# Patient Record
Sex: Female | Born: 1980 | Race: White | Hispanic: No | Marital: Single | Smoking: Current some day smoker
Health system: Southern US, Community
[De-identification: ages and names within clinical notes are randomized; demographics above are authoritative.]

---

## 2004-02-24 ENCOUNTER — Emergency Department: Payer: Self-pay | Admitting: Emergency Medicine

## 2012-07-20 ENCOUNTER — Emergency Department: Payer: Self-pay | Admitting: Emergency Medicine

## 2012-11-20 ENCOUNTER — Emergency Department: Payer: Self-pay | Admitting: Unknown Physician Specialty

## 2012-11-20 LAB — CBC
HCT: 48.3 % — ABNORMAL HIGH (ref 35.0–47.0)
HGB: 16.6 g/dL — ABNORMAL HIGH (ref 12.0–16.0)
MCH: 32.4 pg (ref 26.0–34.0)
MCV: 94 fL (ref 80–100)
RBC: 5.13 10*6/uL (ref 3.80–5.20)
RDW: 13.9 % (ref 11.5–14.5)

## 2012-11-20 LAB — BASIC METABOLIC PANEL
Anion Gap: 7 (ref 7–16)
Calcium, Total: 9 mg/dL (ref 8.5–10.1)
Chloride: 109 mmol/L — ABNORMAL HIGH (ref 98–107)
Creatinine: 0.98 mg/dL (ref 0.60–1.30)
EGFR (Non-African Amer.): >60
Potassium: 3.7 mmol/L (ref 3.5–5.1)

## 2012-11-20 LAB — TROPONIN I: Troponin-I: <0.02 ng/mL

## 2012-12-17 ENCOUNTER — Emergency Department: Payer: Self-pay | Admitting: Emergency Medicine

## 2012-12-19 ENCOUNTER — Ambulatory Visit: Payer: Self-pay | Admitting: Otolaryngology

## 2012-12-19 LAB — HCG, QUANTITATIVE, PREGNANCY: Beta Hcg, Quant.: <1 m[IU]/mL — ABNORMAL LOW

## 2014-03-11 IMAGING — CR DG CHEST 2V
1 series · 3 of 3 positions shown · non-contrast
Comparison: none

REASON FOR EXAM: cough
COMMENTS:

PROCEDURE:     DXR - DXR CHEST PA (OR AP) AND LATERAL  - November 20, 2012  [DATE]
RESULT:     Comparison: None.

[Series 1: pa · 0.17mm/px · 3 of 3 slices shown]
[im 1/3]
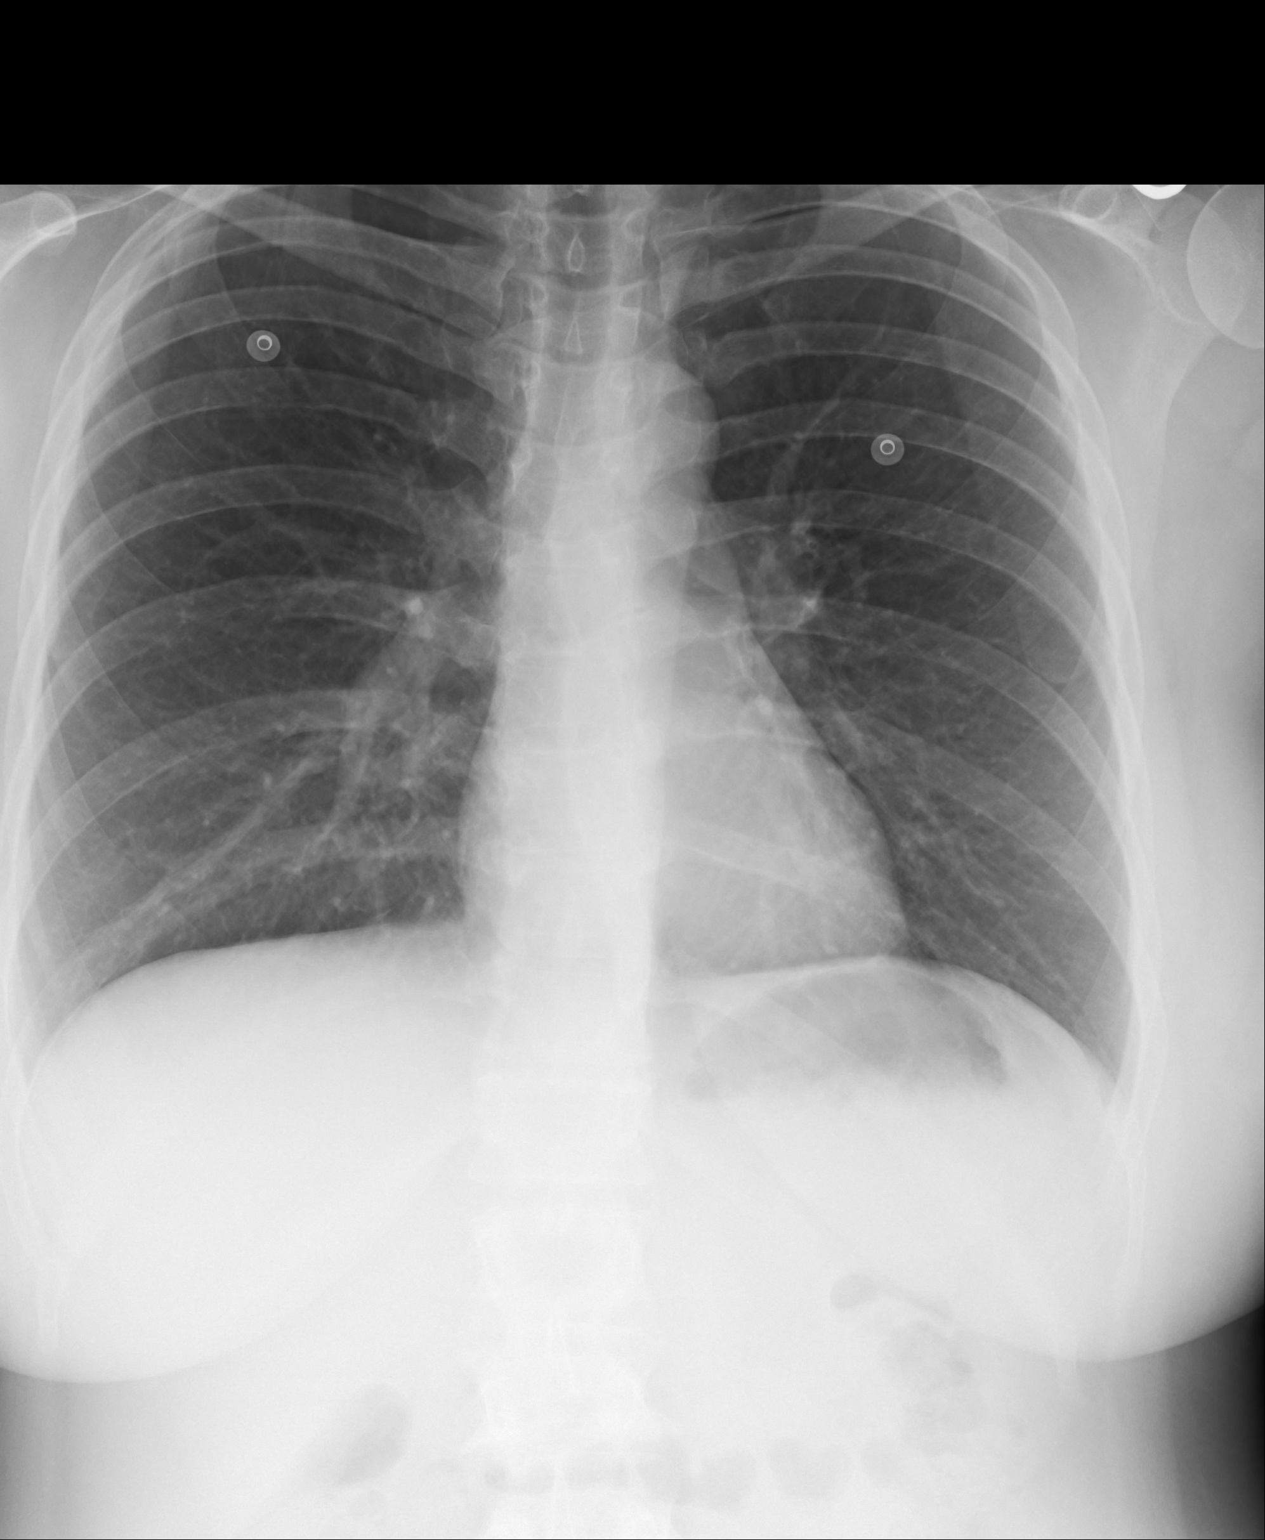
[im 2/3]
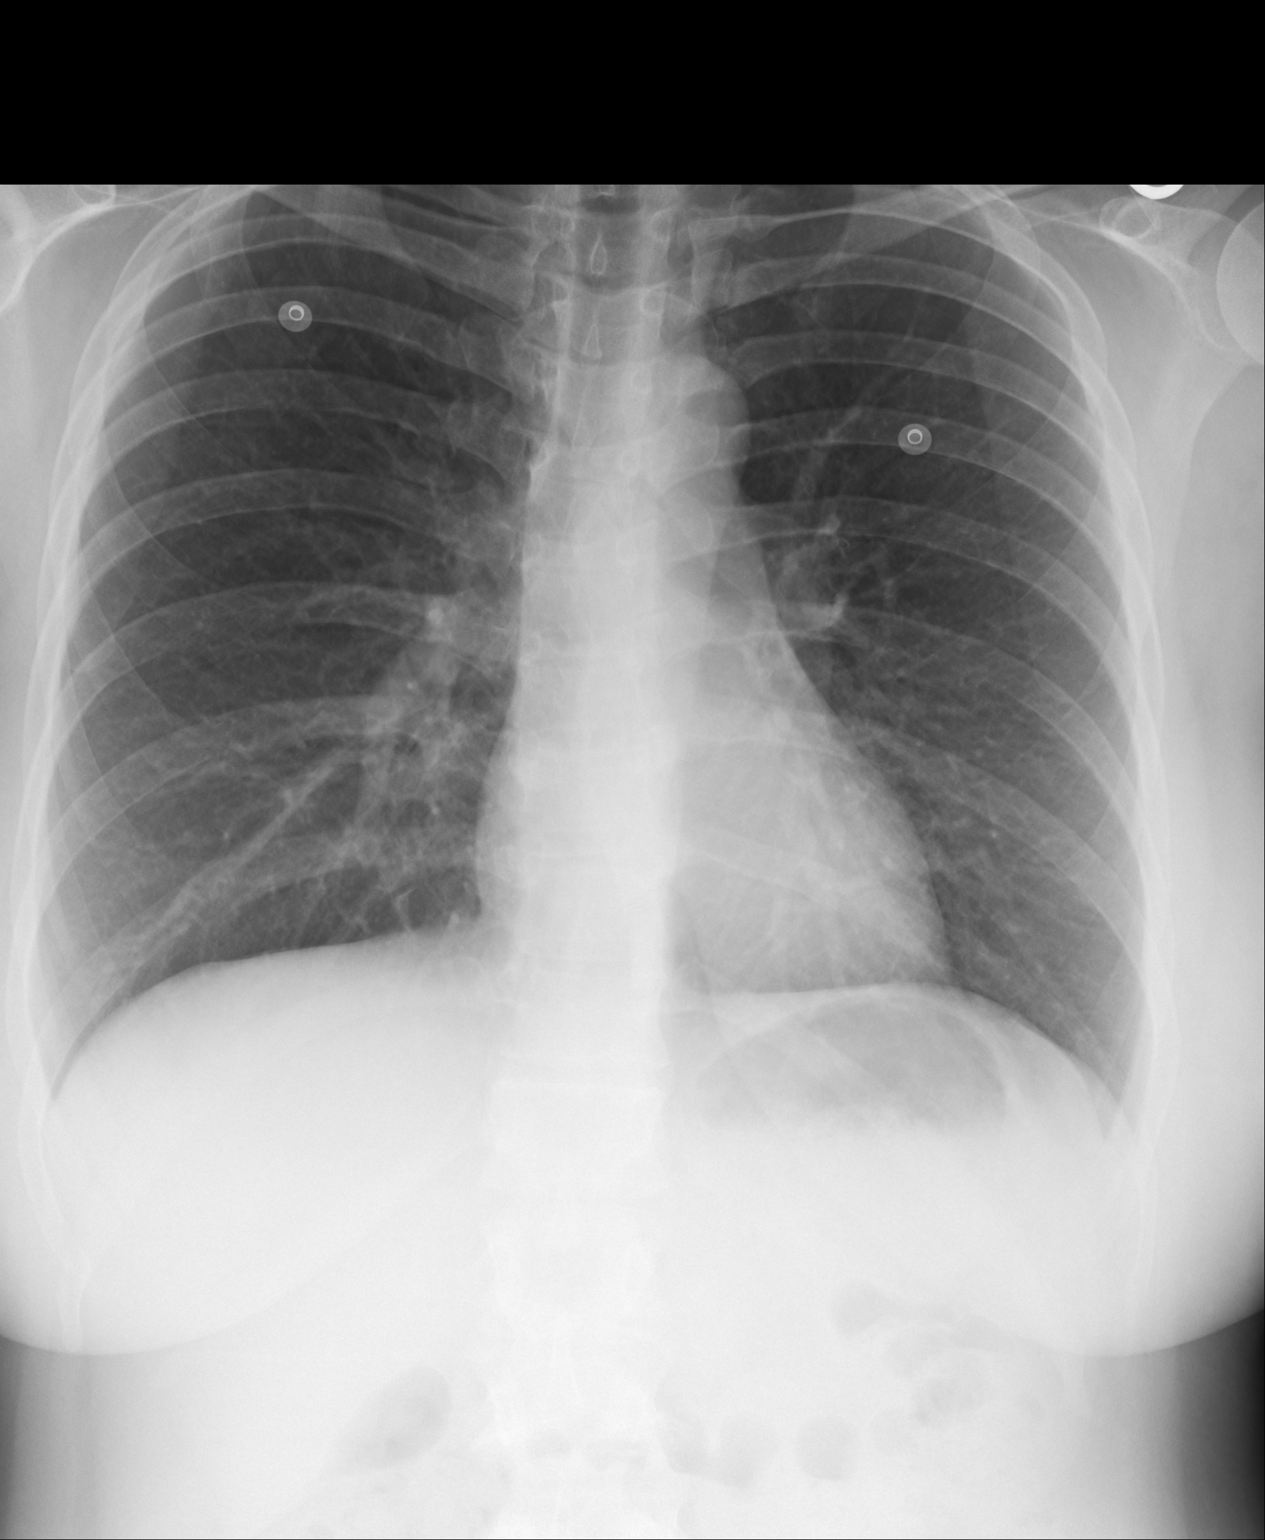
[im 3/3]
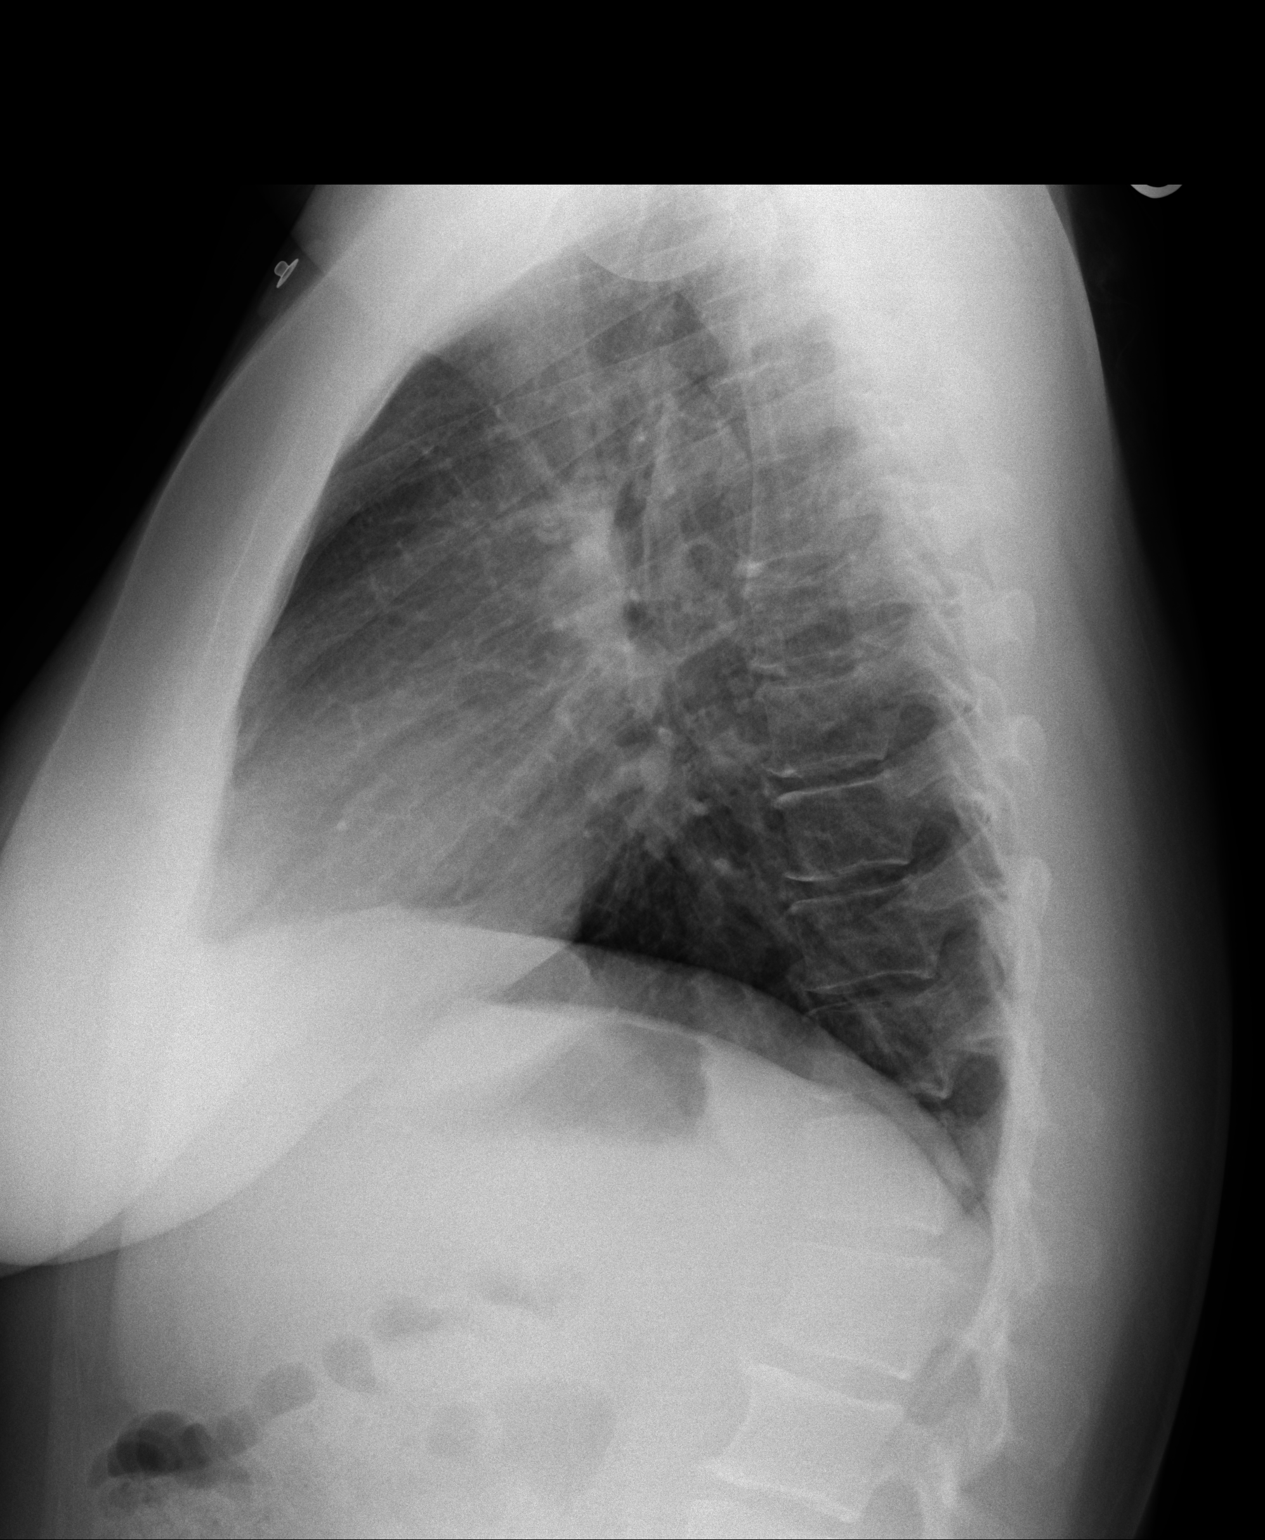

[3 of 3 positions shown; findings below may reference images not displayed]

FINDINGS: The heart and mediastinum are within normal limits. No focal pulmonary
opacities.
IMPRESSION: No acute cardiopulmonary disease.

[REDACTED]

## 2016-07-29 ENCOUNTER — Encounter (HOSPITAL_COMMUNITY): Payer: Self-pay | Admitting: *Deleted

## 2016-07-29 ENCOUNTER — Emergency Department (HOSPITAL_COMMUNITY): Payer: Medicaid - Out of State

## 2016-07-29 ENCOUNTER — Emergency Department (HOSPITAL_COMMUNITY)
Admission: EM | Admit: 2016-07-29 | Discharge: 2016-07-29 | Disposition: A | Discharge status: Home / Self Care | Payer: Medicaid - Out of State | Attending: Emergency Medicine | Admitting: Emergency Medicine

## 2016-07-29 DIAGNOSIS — S2232XA Fracture of one rib, left side, initial encounter for closed fracture: Secondary | ICD-10-CM | POA: Insufficient documentation

## 2016-07-29 DIAGNOSIS — F17200 Nicotine dependence, unspecified, uncomplicated: Secondary | ICD-10-CM | POA: Insufficient documentation

## 2016-07-29 DIAGNOSIS — Y939 Activity, unspecified: Secondary | ICD-10-CM | POA: Diagnosis not present

## 2016-07-29 DIAGNOSIS — Y999 Unspecified external cause status: Secondary | ICD-10-CM | POA: Diagnosis not present

## 2016-07-29 DIAGNOSIS — J4521 Mild intermittent asthma with (acute) exacerbation: Secondary | ICD-10-CM | POA: Diagnosis not present

## 2016-07-29 DIAGNOSIS — S299XXA Unspecified injury of thorax, initial encounter: Secondary | ICD-10-CM | POA: Diagnosis present

## 2016-07-29 DIAGNOSIS — Y929 Unspecified place or not applicable: Secondary | ICD-10-CM | POA: Diagnosis not present

## 2016-07-29 DIAGNOSIS — M7918 Myalgia, other site: Secondary | ICD-10-CM

## 2016-07-29 LAB — URINALYSIS, ROUTINE W REFLEX MICROSCOPIC
Bilirubin Urine: NEGATIVE
Glucose, UA: NEGATIVE mg/dL
Hgb urine dipstick: NEGATIVE
Ketones, ur: NEGATIVE mg/dL
Nitrite: NEGATIVE
Protein, ur: 30 mg/dL — AB
Specific Gravity, Urine: 1.02 (ref 1.005–1.030)
pH: 5 (ref 5.0–8.0)

## 2016-07-29 LAB — POC URINE PREG, ED: Preg Test, Ur: NEGATIVE

## 2016-07-29 MED ORDER — PREDNISONE 20 MG PO TABS
60.0000 mg | ORAL_TABLET | Freq: Once | ORAL | Status: AC
  Administered 2016-07-29: 60 mg via ORAL
  Filled 2016-07-29: qty 3

## 2016-07-29 MED ORDER — OXYCODONE-ACETAMINOPHEN 5-325 MG PO TABS
1.0000 | ORAL_TABLET | Freq: Once | ORAL | Status: AC
Start: 2016-07-29 — End: 2016-07-29
  Administered 2016-07-29: 1 via ORAL
  Filled 2016-07-29: qty 1

## 2016-07-29 MED ORDER — IBUPROFEN 600 MG PO TABS: 600.0000 mg | ORAL_TABLET | Freq: Four times a day (QID) | ORAL | 0 refills | Status: AC | PRN

## 2016-07-29 MED ORDER — OXYCODONE-ACETAMINOPHEN 5-325 MG PO TABS: 1.0000 | ORAL_TABLET | Freq: Four times a day (QID) | ORAL | 0 refills | Status: AC | PRN

## 2016-07-29 MED ORDER — IPRATROPIUM-ALBUTEROL 0.5-2.5 (3) MG/3ML IN SOLN
3.0000 mL | Freq: Once | RESPIRATORY_TRACT | Status: AC
  Administered 2016-07-29: 3 mL via RESPIRATORY_TRACT
  Filled 2016-07-29: qty 3

## 2016-07-29 MED ORDER — ALBUTEROL SULFATE HFA 108 (90 BASE) MCG/ACT IN AERS: 1.0000 | INHALATION_SPRAY | Freq: Four times a day (QID) | RESPIRATORY_TRACT | 0 refills | Status: AC | PRN

## 2016-07-29 MED ORDER — PREDNISONE 20 MG PO TABS: 60.0000 mg | ORAL_TABLET | Freq: Every day | ORAL | 0 refills | Status: AC
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
## 2016-07-29 NOTE — ED Notes (Signed)
Patient transported to X-ray 
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
## 2016-07-29 NOTE — ED Notes (Signed)
Leo at bedside
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
## 2016-09-02 ENCOUNTER — Emergency Department (HOSPITAL_COMMUNITY)
Admission: EM | Admit: 2016-09-02 | Discharge: 2016-09-02 | Discharge status: Against Medical Advice | Payer: Medicaid - Out of State

## 2016-09-02 NOTE — ED Notes (Signed)
Pt called for triage x 2 without answer  

## 2016-09-02 NOTE — ED Notes (Signed)
Pt called for triage x3. No answer.  

## 2016-09-03 ENCOUNTER — Encounter (HOSPITAL_COMMUNITY): Payer: Self-pay | Admitting: Emergency Medicine

## 2016-09-03 ENCOUNTER — Emergency Department (HOSPITAL_COMMUNITY): Payer: Medicaid - Out of State

## 2016-09-03 ENCOUNTER — Emergency Department (HOSPITAL_COMMUNITY)
Admission: EM | Admit: 2016-09-03 | Discharge: 2016-09-03 | Disposition: A | Discharge status: Home / Self Care | Payer: Medicaid - Out of State | Attending: Emergency Medicine | Admitting: Emergency Medicine

## 2016-09-03 DIAGNOSIS — O26891 Other specified pregnancy related conditions, first trimester: Secondary | ICD-10-CM | POA: Insufficient documentation

## 2016-09-03 DIAGNOSIS — O99331 Smoking (tobacco) complicating pregnancy, first trimester: Secondary | ICD-10-CM | POA: Diagnosis not present

## 2016-09-03 DIAGNOSIS — O0281 Inappropriate change in quantitative human chorionic gonadotropin (hCG) in early pregnancy: Secondary | ICD-10-CM | POA: Insufficient documentation

## 2016-09-03 DIAGNOSIS — Z3A01 Less than 8 weeks gestation of pregnancy: Secondary | ICD-10-CM

## 2016-09-03 DIAGNOSIS — R109 Unspecified abdominal pain: Secondary | ICD-10-CM | POA: Insufficient documentation

## 2016-09-03 DIAGNOSIS — Z3A08 8 weeks gestation of pregnancy: Secondary | ICD-10-CM | POA: Insufficient documentation

## 2016-09-03 DIAGNOSIS — Z79899 Other long term (current) drug therapy: Secondary | ICD-10-CM | POA: Diagnosis not present

## 2016-09-03 DIAGNOSIS — R935 Abnormal findings on diagnostic imaging of other abdominal regions, including retroperitoneum: Secondary | ICD-10-CM | POA: Insufficient documentation

## 2016-09-03 DIAGNOSIS — F17200 Nicotine dependence, unspecified, uncomplicated: Secondary | ICD-10-CM | POA: Insufficient documentation

## 2016-09-03 LAB — URINALYSIS, ROUTINE W REFLEX MICROSCOPIC
BILIRUBIN URINE: NEGATIVE
Bacteria, UA: NONE SEEN
Glucose, UA: NEGATIVE mg/dL
Ketones, ur: NEGATIVE mg/dL
Nitrite: NEGATIVE
Protein, ur: NEGATIVE mg/dL
SPECIFIC GRAVITY, URINE: 1.005 (ref 1.005–1.030)
pH: 7 (ref 5.0–8.0)

## 2016-09-03 LAB — CBC WITH DIFFERENTIAL/PLATELET
BASOS PCT: 0 %
Basophils Absolute: 0 10*3/uL (ref 0.0–0.1)
Eosinophils Absolute: 0 10*3/uL (ref 0.0–0.7)
Eosinophils Relative: 0 %
HCT: 45.7 % (ref 36.0–46.0)
HEMOGLOBIN: 16 g/dL — AB (ref 12.0–15.0)
LYMPHS PCT: 6 %
Lymphs Abs: 0.9 10*3/uL (ref 0.7–4.0)
MCH: 31.8 pg (ref 26.0–34.0)
MCHC: 35 g/dL (ref 30.0–36.0)
MCV: 90.9 fL (ref 78.0–100.0)
MONO ABS: 0.9 10*3/uL (ref 0.1–1.0)
MONOS PCT: 6 %
NEUTROS ABS: 13.6 10*3/uL — AB (ref 1.7–7.7)
NEUTROS PCT: 88 %
Platelets: 207 10*3/uL (ref 150–400)
RBC: 5.03 MIL/uL (ref 3.87–5.11)
RDW: 12.8 % (ref 11.5–15.5)
WBC: 15.5 10*3/uL — ABNORMAL HIGH (ref 4.0–10.5)

## 2016-09-03 LAB — BASIC METABOLIC PANEL
Anion gap: 6 (ref 5–15)
BUN: 9 mg/dL (ref 6–20)
CALCIUM: 8.7 mg/dL — AB (ref 8.9–10.3)
CHLORIDE: 101 mmol/L (ref 101–111)
CO2: 26 mmol/L (ref 22–32)
Creatinine, Ser: 1.03 mg/dL — ABNORMAL HIGH (ref 0.44–1.00)
GFR calc non Af Amer: >60 mL/min (ref >60)
Glucose, Bld: 120 mg/dL — ABNORMAL HIGH (ref 65–99)
POTASSIUM: 3.8 mmol/L (ref 3.5–5.1)
Sodium: 133 mmol/L — ABNORMAL LOW (ref 135–145)

## 2016-09-03 LAB — HCG, QUANTITATIVE, PREGNANCY: hCG, Beta Chain, Quant, S: 419 m[IU]/mL — ABNORMAL HIGH (ref <5)

## 2016-09-03 MED ORDER — ACETAMINOPHEN 325 MG PO TABS
650.0000 mg | ORAL_TABLET | Freq: Once | ORAL | Status: AC
  Administered 2016-09-03: 650 mg via ORAL
  Filled 2016-09-03: qty 2

## 2016-09-03 MED ORDER — PRENATAL COMPLETE 14-0.4 MG PO TABS: 1.0000 | ORAL_TABLET | Freq: Every day | ORAL | 0 refills | Status: AC

## 2016-09-03 NOTE — Discharge Instructions (Signed)
I would continue the keflex you were prescribed yesterday.  Can take tylenol for pain (do not take more than 2g tylenol in a day). Your urine has been sent for culture so we will call you if anything needs to be changed. Follow-up with the [facility] clinic for ongoing prenatal care. Return here for new concerns.

## 2016-09-03 NOTE — ED Notes (Signed)
Patient transported to Ultrasound 

## 2016-09-03 NOTE — ED Provider Notes (Signed)
[facility] Provider Note   CSN: [id number] Arrival date & time: 09/03/16  9147     History   Chief Complaint Chief Complaint  Patient presents with  . Flank Pain    HPI Paige Bradley is a 36 y.o. female.  The history is provided by the patient and medical records.    36 y.o. F here with bilateral flank pain.  States she has been having dysuria and urinary frequency for about a week now. States she was seen at urgent care yesterday diagnosed with UTI and started on keflex.  She also found out she was pregnant yesterday.  Unsure how far along she is.  States LMP was beginning of March.  States she has been taking the keflex as directed but has only had 2 doses thus far.  States pain has not gotten any better.  Reports pain in both flanks, left slightly worse than right.  She has not had any pelvic pain, vaginal discharge, or vaginal bleeding.  Some nausea but denies vomiting.  Reports fever last night.  States hx of UTI's with prior pregnancies.  No hx of ectopic.  History reviewed. No pertinent past medical history.  There are no active problems to display for this patient.   History reviewed. No pertinent surgical history.  OB History    Gravida Para Term Preterm AB Living   4         2   SAB TAB Ectopic Multiple Live Births                   Home Medications    Prior to Admission medications   Medication Sig Start Date End Date Taking? Authorizing Provider  albuterol (PROVENTIL HFA;VENTOLIN HFA) 108 (90 Base) MCG/ACT inhaler Inhale 1-2 puffs into the lungs every 6 (six) hours as needed for wheezing or shortness of breath. 07/29/16   Audry Pili, PA-C  ibuprofen (ADVIL,MOTRIN) 600 MG tablet Take 1 tablet (600 mg total) by mouth every 6 (six) hours as needed. 07/29/16   Audry Pili, PA-C  oxyCODONE-acetaminophen (PERCOCET/ROXICET) 5-325 MG tablet Take 1 tablet by mouth every 6 (six) hours as needed for severe pain. 07/29/16   Audry Pili, PA-C    Family  History No family history on file.  Social History Social History  Substance Use Topics  . Smoking status: Current Some Day Smoker  . Smokeless tobacco: Never Used  . Alcohol use No     Allergies   Patient has no known allergies.   Review of Systems Review of Systems  Genitourinary: Positive for dysuria, flank pain and frequency. Negative for menstrual problem, pelvic pain, vaginal bleeding, vaginal discharge and vaginal pain.  All other systems reviewed and are negative.    Physical Exam Updated Vital Signs BP (!) 158/111   Pulse (!) 110   Temp 98.8 F (37.1 C) (Oral)   Resp 20   LMP 07/11/2016   SpO2 99%   Physical Exam  Constitutional: She is oriented to person, place, and time. She appears well-developed and well-nourished.  HENT:  Head: Normocephalic and atraumatic.  Mouth/Throat: Oropharynx is clear and moist.  Eyes: Conjunctivae and EOM are normal. Pupils are equal, round, and reactive to light.  Neck: Normal range of motion.  Cardiovascular: Normal rate, regular rhythm and normal heart sounds.   Pulmonary/Chest: Effort normal and breath sounds normal. No respiratory distress. She has no wheezes.  Abdominal: Soft. Bowel sounds are normal. There is no tenderness. There is CVA tenderness. There is  no rebound.  bilteral CVA tenderness noted, L > R No abdominal or pelvic tenderness  Musculoskeletal: Normal range of motion.  Neurological: She is alert and oriented to person, place, and time.  Skin: Skin is warm and dry.  Psychiatric: She has a normal mood and affect.  Nursing note and vitals reviewed.    ED Treatments / Results  Labs (all labs ordered are listed, but only abnormal results are displayed) Labs Reviewed  CBC WITH DIFFERENTIAL/PLATELET - Abnormal; Notable for the following:       Result Value   WBC 15.5 (*)    Hemoglobin 16.0 (*)    Neutro Abs 13.6 (*)    All other components within normal limits  BASIC METABOLIC PANEL - Abnormal; Notable  for the following:    Sodium 133 (*)    Glucose, Bld 120 (*)    Creatinine, Ser 1.03 (*)    Calcium 8.7 (*)    All other components within normal limits  HCG, QUANTITATIVE, PREGNANCY - Abnormal; Notable for the following:    hCG, Beta Chain, Quant, S 419 (*)    All other components within normal limits  URINALYSIS, ROUTINE W REFLEX MICROSCOPIC - Abnormal; Notable for the following:    Color, Urine STRAW (*)    Hgb urine dipstick SMALL (*)    Leukocytes, UA TRACE (*)    Squamous Epithelial / LPF 0-5 (*)    All other components within normal limits  URINE CULTURE    EKG  EKG Interpretation None       Radiology US Renal  Result Date: 09/03/2016 CLINICAL DATA:  Left-sided flank pain for 3 days EXAM: RENAL / URINARY TRACT ULTRASOUND COMPLETE COMPARISON:  None. FINDINGS: Right Kidney: Length: 10.3 cm. Echogenicity within normal limits. No mass or hydronephrosis visualized. Left Kidney: Length: 11.4 cm. Echogenicity within normal limits. No mass or hydronephrosis visualized. Bladder: Appears normal for degree of bladder distention. IMPRESSION: No acute abnormality noted. Electronically Signed   By: Alcide Clever M.D.   On: 09/03/2016 09:57    Procedures Procedures (including critical care time)  Medications Ordered in ED Medications  acetaminophen (TYLENOL) tablet 650 mg (650 mg Oral Given 09/03/16 0917)     Initial Impression / Assessment and Plan / ED Course  I have reviewed the triage vital signs and the nursing notes.  Pertinent labs & imaging results that were available during my care of the patient were reviewed by me and considered in my medical decision making (see chart for details).  36 y.o. F here with flank pain.  Reports urinary symptoms x1 week.  Started on keflex yesterday.  Also found out she was pregnant yesterday.  States no change after meds.  She is afebrile, non-toxic in appearance here.  Abdomen soft, CVA tenderness bilaterally. Remainder of abdomen is soft  and benign. Denies pelvic pain, vaginal discharge, or vaginal bleeding.  Reports LMP beginning of march.  Seen here on 07/29/16 and negative pregnancy test at that time.  Will plan for labs, UA, renal ultrasound.  Will get hcg as well.  hcg 419 consistent with about [redacted]wk gestation.  Mild leukocytosis.  UA with trace leuks, 6-30 WBC, small hemoglobin.  Sent for culture.  Renal US was obtained-- no acute findings of hydronephrosis to suggest acute stone.  On re-check patient is resting comfortably, however she did express she was upset she has not receive any stronger pain medication.  Given her pregnancy, do not feel narcotics are indicated which I have discussed with  her.  She remains without pelvic pain, vaginal bleeding, or loss of fluids.  Unlikely to see gestational findings on [place] given early pregnancy.  No red flag symptoms concerning for ectopic pregnancy at this time and no hx of same.  I have encouraged her to continue keflex pending urine culture, will start prenatals.  Does not currently have GYN so referred to women's clinic for follow-up for close follow-up.  Return precautions were given for new/worsening symptoms.  At time of discharge, notified by RN that patient was somewhat tachycardic around 120.  HR has been mildly elevated here.  No chest pain or SOB.  BP mildly elevated as well. Attempted to go in and reassess, however patient had already left.    Case discussed with attending physician, Dr. Fayrene Fearing, who agrees with assessment and plan of care.  Final Clinical Impressions(s) / ED Diagnoses   Final diagnoses:  Left flank pain  Less than [redacted] weeks gestation of pregnancy    New Prescriptions Discharge Medication List as of 09/03/2016 11:09 AM    START taking these medications   Details  Prenatal Vit-Fe Fumarate-FA (PRENATAL COMPLETE) 14-0.4 MG TABS Take 1 tablet by mouth daily., Starting Fri 09/03/2016, Print         Garlon Hatchet, PA-C 09/03/16 1232    Rolland Porter,  MD 09/18/16 [phone]

## 2016-09-03 NOTE — ED Triage Notes (Signed)
Pt reports bilateral flank pain with painful urination x1 week, states she was seen at [facility] yesterday and given keflex. Reports fevers of 103 over night. [facility], unsure how far along she is.

## 2016-09-03 NOTE — ED Notes (Signed)
Pt departed ambulatory after refusing further assessment. Steady gait. Pt did take instructions after they were explained but declined to sign.

## 2016-09-04 LAB — URINE CULTURE
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
## 2018-03-04 IMAGING — US US RENAL
1 series · 14 of 25 positions shown · non-contrast
Comparison: None.

CLINICAL DATA: Left-sided flank pain for 3 days

EXAM:
RENAL / URINARY TRACT ULTRASOUND COMPLETE

[Series 1: us renal · 0.23mm/px · 14 of 27 slices shown]
[im 1/27]
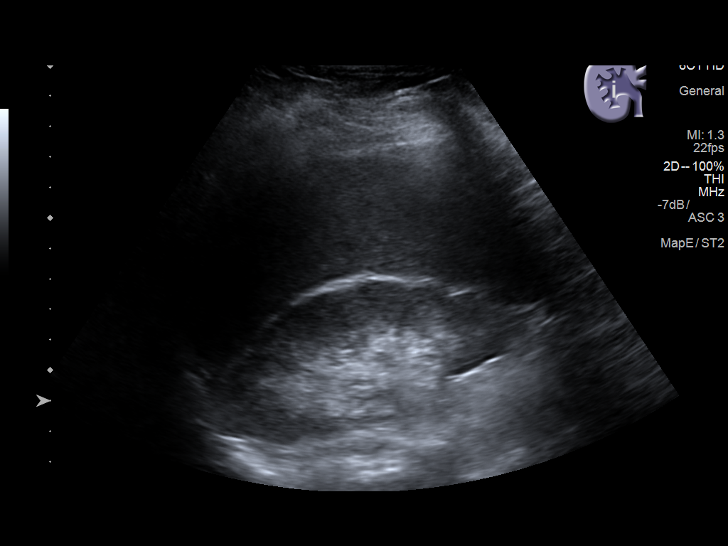
[im 3/27]
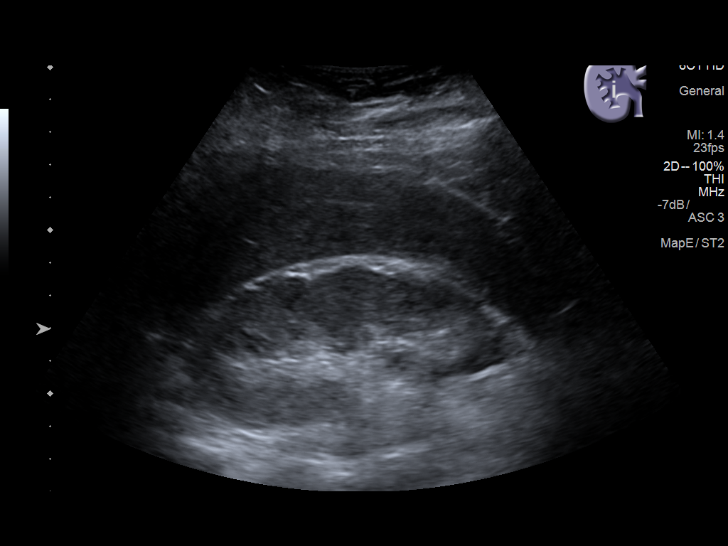
[im 5/27]
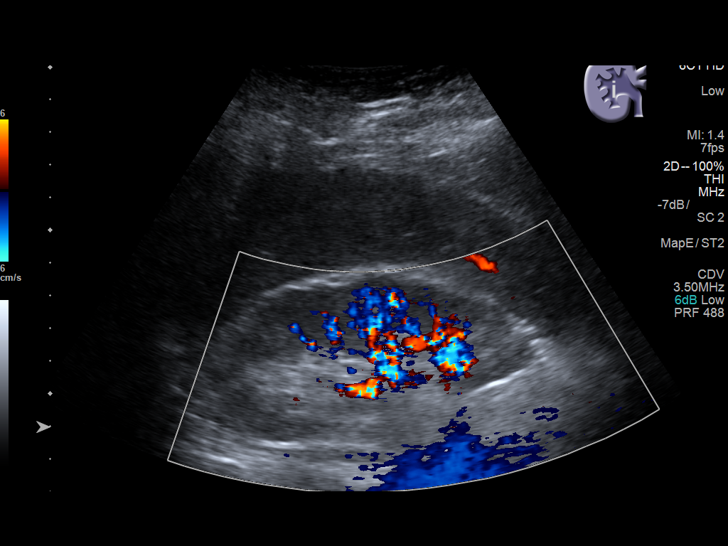
[im 7/27]
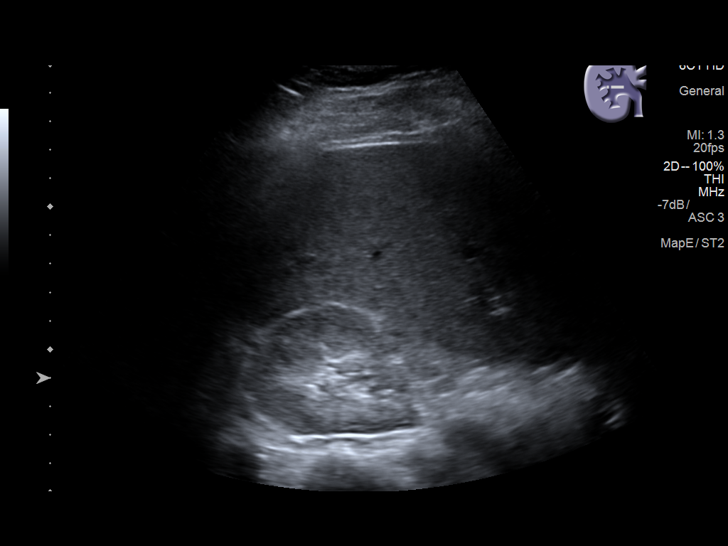
[im 9/27]
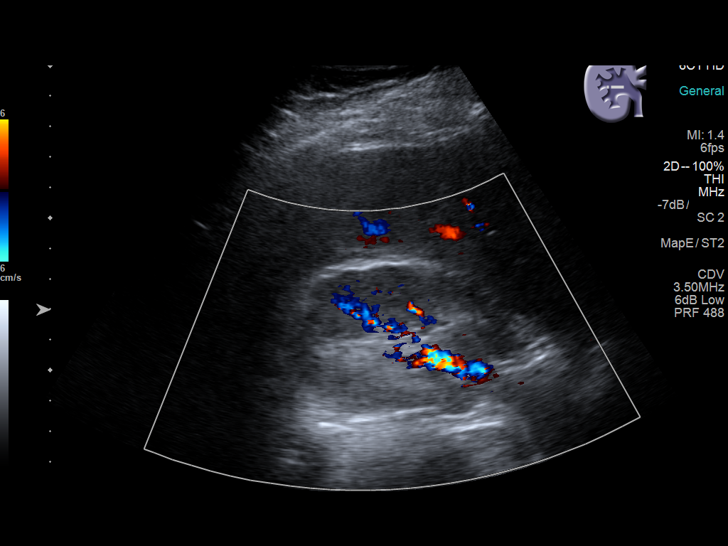
[im 10/27]
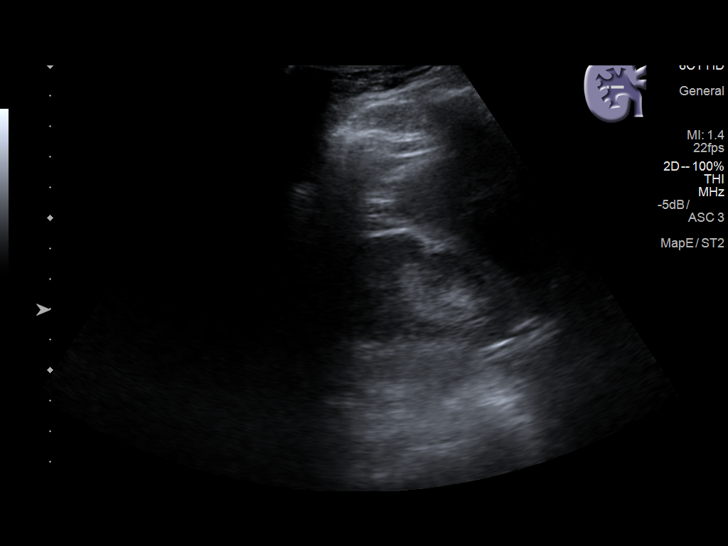
[im 12/27]
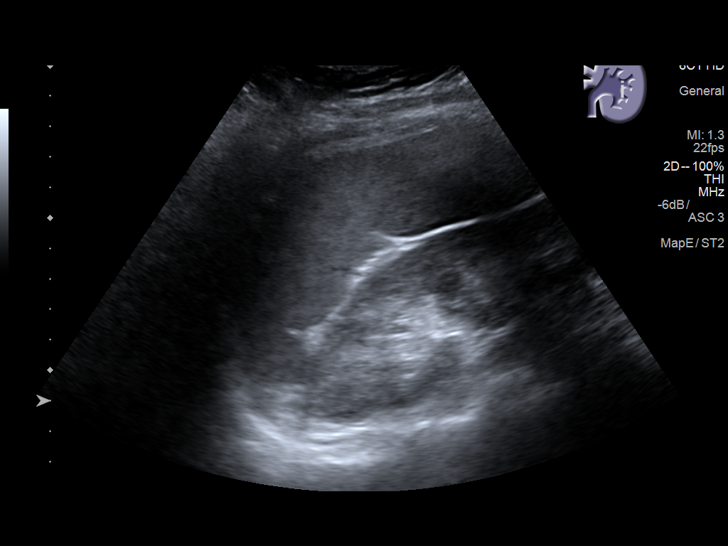
[im 15/27]
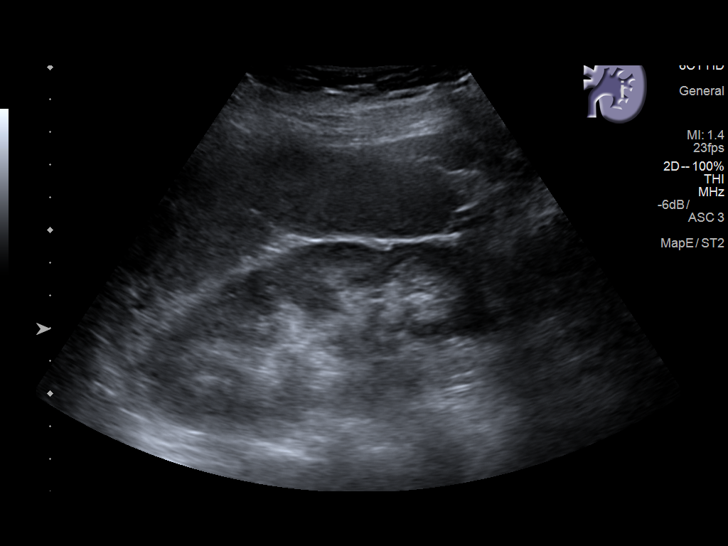
[im 17/27]
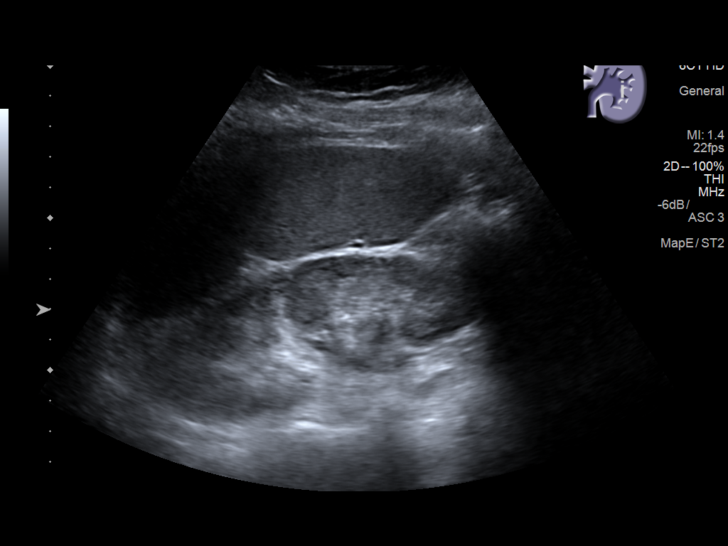
[im 18/27]
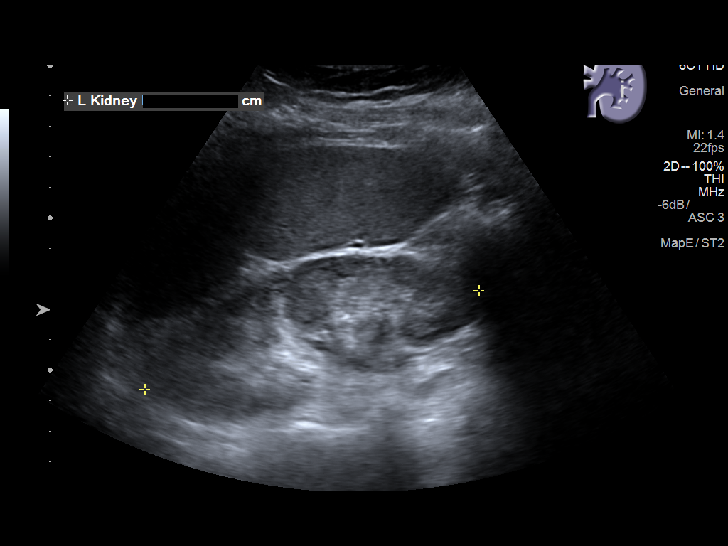
[im 20/27]
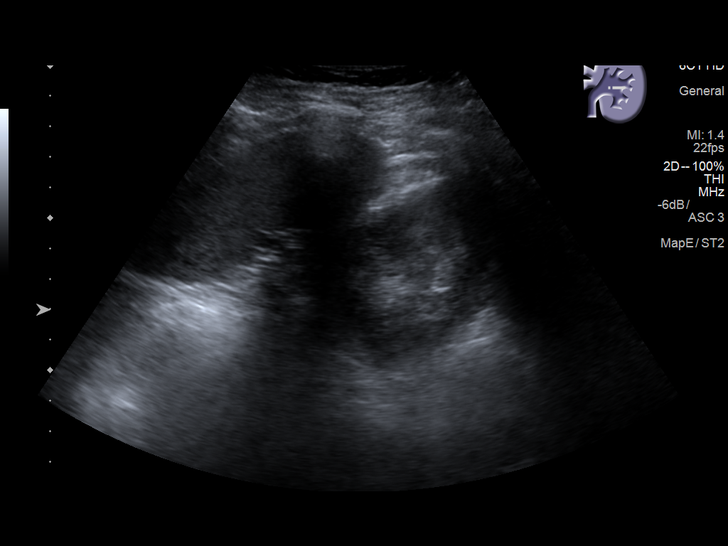
[im 22/27]
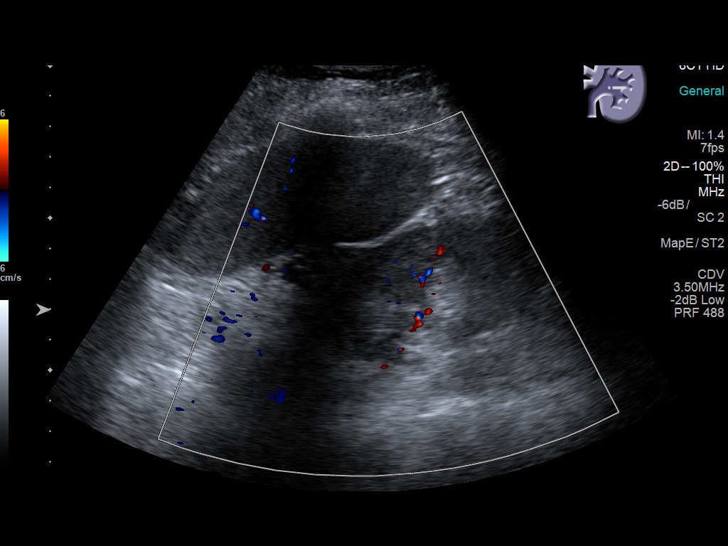
[im 24/27]
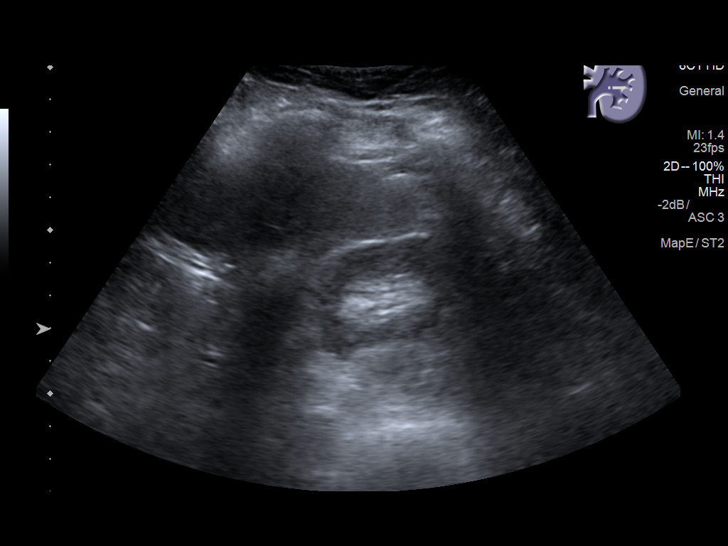
[im 27/27]
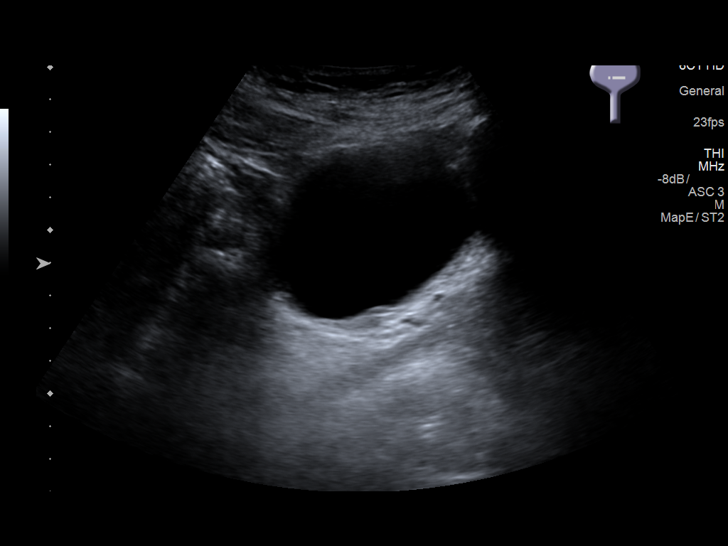

[14 of 25 positions shown; findings below may reference images not displayed]

FINDINGS: Right Kidney:

Length: 10.3 cm.. Echogenicity within normal limits. No mass or
hydronephrosis visualized.

Left Kidney:

Length: 11.4 cm.. Echogenicity within normal limits. No mass or
hydronephrosis visualized.

Bladder:

Appears normal for degree of bladder distention.
IMPRESSION: No acute abnormality noted.
